# Patient Record
Sex: Male | Born: 1995 | Race: White | Hispanic: No | Marital: Single | Smoking: Never smoker
Health system: Southern US, Community
[De-identification: ages and names within clinical notes are randomized; demographics above are authoritative.]

## PROBLEM LIST (undated history)

## (undated) DIAGNOSIS — K760 Fatty (change of) liver, not elsewhere classified: Secondary | ICD-10-CM

## (undated) HISTORY — DX: Fatty (change of) liver, not elsewhere classified: K76.0

## (undated) HISTORY — PX: WISDOM TOOTH EXTRACTION: SHX21

---
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
## 2013-09-02 ENCOUNTER — Emergency Department (HOSPITAL_COMMUNITY): Payer: BC Managed Care – PPO

## 2013-09-02 ENCOUNTER — Emergency Department (HOSPITAL_COMMUNITY)
Admission: EM | Admit: 2013-09-02 | Discharge: 2013-09-02 | Disposition: A | Discharge status: Home / Self Care | Payer: BC Managed Care – PPO | Attending: Emergency Medicine | Admitting: Emergency Medicine

## 2013-09-02 ENCOUNTER — Encounter (HOSPITAL_COMMUNITY): Payer: Self-pay | Admitting: Emergency Medicine

## 2013-09-02 DIAGNOSIS — N433 Hydrocele, unspecified: Secondary | ICD-10-CM

## 2013-09-02 LAB — URINALYSIS, ROUTINE W REFLEX MICROSCOPIC
Bilirubin Urine: NEGATIVE
Glucose, UA: NEGATIVE mg/dL
HGB URINE DIPSTICK: NEGATIVE
Ketones, ur: NEGATIVE mg/dL
Leukocytes, UA: NEGATIVE
Nitrite: NEGATIVE
PH: 7 (ref 5.0–8.0)
PROTEIN: NEGATIVE mg/dL
Specific Gravity, Urine: 1.01 (ref 1.005–1.030)
Urobilinogen, UA: 0.2 mg/dL (ref 0.0–1.0)

## 2013-09-02 NOTE — ED Notes (Signed)
Pain groin  For 1. 5 mos,  Increased pain with lifting .

## 2013-09-02 NOTE — ED Notes (Signed)
Pt c/o left testicular pain x1 month. Pt states pain has worsened over past week. Pt also reports "some swelling".

## 2013-09-02 NOTE — ED Notes (Signed)
Patient resting in a position of comfort. Family member at bedside. Patient states left testicular pain, denies painful urination and discharge. No needs voiced at this time.

## 2013-09-02 NOTE — ED Provider Notes (Signed)
CSN: [id number]     Arrival date & time 09/02/13  1635 History   First MD Initiated Contact with Patient 09/02/13 1738     Chief Complaint  Patient presents with  . Groin Pain     (Consider location/radiation/quality/duration/timing/severity/associated sxs/prior Treatment) Patient is a 18 y.o. male presenting with groin pain. The history is provided by the patient.  Groin Pain Pertinent negatives include no chest pain, no abdominal pain and no shortness of breath.   patient has pain in his left scrotum/testicle area. He's had for the last 2 weeks on-and-off. Worse with lifting things. No trauma. No dysuria. He states it feels full. No bulging. He denies possibility of STD. The pain is dull. It doesn't hurt much unless he strains.  History reviewed. No pertinent past medical history. History reviewed. No pertinent past surgical history. History reviewed. No pertinent family history. History  Substance Use Topics  . Smoking status: Never Smoker   . Smokeless tobacco: Not on file  . Alcohol Use: No    Review of Systems  Constitutional: Negative for activity change and appetite change.  Respiratory: Negative for chest tightness and shortness of breath.   Cardiovascular: Negative for chest pain.  Gastrointestinal: Negative for nausea, vomiting, abdominal pain and diarrhea.  Genitourinary: Positive for testicular pain. Negative for frequency, hematuria, flank pain, discharge, penile swelling and penile pain.  Musculoskeletal: Negative for back pain and neck stiffness.  Skin: Negative for rash.  Neurological: Negative for weakness.      Allergies  Review of patient's allergies indicates no known allergies.  Home Medications   Prior to Admission medications   Not on File   BP 144/85  Pulse 62  Temp(Src) 98.2 F (36.8 C) (Oral)  Resp 16  Ht 5\' 9"  (1.753 m)  Wt 225 lb (102.059 kg)  BMI 33.21 kg/m2  SpO2 100% Physical Exam  Constitutional: He appears well-developed and  well-nourished.  Cardiovascular: Normal rate and regular rhythm.   Pulmonary/Chest: Effort normal.  Abdominal: Soft. There is no tenderness.  Genitourinary: Prostate normal and penis normal.  Left testicle slightly larger than contralateral. Normal lie. Mild posterior tenderness. There is no fluctuance superior it may be hydrocele. No hernias palpated  Musculoskeletal: Normal range of motion.  Neurological: He is alert.    ED Course  Procedures (including critical care time) Labs Review Labs Reviewed  GC/CHLAMYDIA PROBE AMP  URINALYSIS, ROUTINE W REFLEX MICROSCOPIC    Imaging Review [place] Scrotum  09/02/2013   CLINICAL DATA:  Left testicular pain.  EXAM: SCROTAL ULTRASOUND  DOPPLER ULTRASOUND OF THE TESTICLES  TECHNIQUE: Complete ultrasound examination of the testicles, epididymis, and other scrotal structures was performed. Color and spectral Doppler ultrasound were also utilized to evaluate blood flow to the testicles.  COMPARISON:  None.  FINDINGS: Right testicle  Measurements: 4.8 x 2.3 x 2.3 cm. No mass or microlithiasis visualized.  Left testicle  Measurements: 4.3 x 2.3 x 3.2 cm. No mass or microlithiasis visualized.  Right epididymis:  Normal in size and appearance.  Left epididymis:  Normal in size and appearance.  Hydrocele:  Mottled left hydrocele is noted.  Varicocele:  None visualized.  Pulsed Doppler interrogation of both testes demonstrates low resistance arterial and venous waveforms bilaterally.  IMPRESSION: Mild left hydrocele.  No evidence of testicular torsion or mass.   Electronically Signed   By: Roque LiasJames  Green M.D.   On: 09/02/2013 20:22   [place] Art/ven Flow Abd Pelv Doppler  09/02/2013   CLINICAL DATA:  Left testicular  pain.  EXAM: SCROTAL ULTRASOUND  DOPPLER ULTRASOUND OF THE TESTICLES  TECHNIQUE: Complete ultrasound examination of the testicles, epididymis, and other scrotal structures was performed. Color and spectral Doppler ultrasound were also utilized to evaluate blood  flow to the testicles.  COMPARISON:  None.  FINDINGS: Right testicle  Measurements: 4.8 x 2.3 x 2.3 cm. No mass or microlithiasis visualized.  Left testicle  Measurements: 4.3 x 2.3 x 3.2 cm. No mass or microlithiasis visualized.  Right epididymis:  Normal in size and appearance.  Left epididymis:  Normal in size and appearance.  Hydrocele:  Mottled left hydrocele is noted.  Varicocele:  None visualized.  Pulsed Doppler interrogation of both testes demonstrates low resistance arterial and venous waveforms bilaterally.  IMPRESSION: Mild left hydrocele.  No evidence of testicular torsion or mass.   Electronically Signed   By: Roque LiasJames  Green M.D.   On: 09/02/2013 20:22     EKG Interpretation None      MDM   Final diagnoses:  Hydrocele in adult    Patient testicle pain. Has hydrocele on left. Likely cause the pain. Gonorrhea and Chlamydia sent in urine. Urinalysis negative. Will followup with urology as needed    Juliet RudeNathan R. Rubin PayorPickering, MD 09/02/13 2052

## 2013-09-02 NOTE — ED Notes (Signed)
Ultrasound at bedside at this time.

## 2013-09-02 NOTE — Discharge Instructions (Signed)
Hydrocele, Adult Fluid can collect around the testicles. This fluid forms in a sac. This condition is called a hydrocele. The collected fluid causes swelling of the scrotum. Usually, it affects just one testicle. Most of the time, the condition does not cause pain. Sometimes, the hydrocele goes away on its own. Other times, surgery is needed to get rid of the fluid. CAUSES A hydrocele does not develop often. Different things can cause a hydrocele in a man, including:  Injury to the scrotum.  Infection.  X-ray of the area around the scrotum.  A tumor or cancer of the testicle.  Twisting of a testicle.  Decreased blood flow to the scrotum. SYMPTOMS   Swelling without pain. The hydrocele feels like a water-filled balloon.  Swelling with pain. This can occur if the hydrocele was caused by infection or twisting.  Mild discomfort in the scrotum.  The hydrocele may feel heavy.  Swelling that gets smaller when you lie down. DIAGNOSIS  Your caregiver will do a physical exam to decide if you have a hydrocele. This may include:  Asking questions about your overall health, today and in the past. Your caregiver may ask about any injuries, X-rays, or infections.  Pushing on your abdomen or asking you to change positions to see if the size of the hydrocele changes.  Shining a light through the scrotum (transillumination) to see if the fluid inside the scrotum is clear.  Blood tests and urine tests to check for infection.  Imaging studies that take pictures of the scrotum and testicles. TREATMENT  Treatment depends in part on what caused the condition. Options include:  Watchful waiting. Your caregiver checks the hydrocele every so often.  Different surgeries to drain the fluid.  A needle may be put into the scrotum to drain fluid (needle aspiration). Fluid often returns after this type of treatment.  A cut (incision) may be made in the scrotum to remove the fluid sac  (hydrocelectomy).  An incision may be made in the groin to repair a hydrocele that has contact with abdominal fluids (communicating hydrocele).  Medicines to treat an infection (antibiotics). HOME CARE INSTRUCTIONS  What you need to do at home may depend on the cause of the hydrocele and type of treatment. In general:  Take all medicine as directed by your caregiver. Follow the directions carefully.  Ask your caregiver if there is anything you should not do while you recover (activities, lifting, work, sex).  If you had surgery to repair a communicating hydrocele, recovery time may vary. Ask you caregiver about your recovery time.  Avoid heavy lifting for 4 to 6 weeks.  If you had an incision on the scrotum or groin, wash it for 2 to 3 days after surgery. Do this as long as the skin is closed and there are no gaps in the wound. Wash gently, and avoid rubbing the incision.  Keep all follow-up appointments. SEEK MEDICAL CARE IF:   Your scrotum seems to be getting larger.  The area becomes more and more uncomfortable. SEEK IMMEDIATE MEDICAL CARE IF:  You have a fever. Document Released: 08/04/2009 Document Revised: 12/05/2012 Document Reviewed: 08/04/2009 ExitCare Patient Information 2015 [employer]. This information is not intended to replace advice given to you by your health care provider. Make sure you discuss any questions you have with your health care provider.  

## 2013-09-02 NOTE — ED Notes (Signed)
Patient verbalizes understanding of discharge instructions, and follow up care if needed. Patient denies pain at time of discharge, patient ambulatory out of department at this time with family.

## 2013-09-03 LAB — GC/CHLAMYDIA PROBE AMP
CT PROBE, AMP APTIMA: NEGATIVE
GC PROBE AMP APTIMA: NEGATIVE

## 2015-06-21 IMAGING — US US SCROTUM
1 series · 14 of 25 positions shown · non-contrast
Comparison: None.

CLINICAL DATA: Left testicular pain.

EXAM:
SCROTAL ULTRASOUND
DOPPLER ULTRASOUND OF THE TESTICLES
TECHNIQUE: Complete ultrasound examination of the testicles, epididymis, and
other scrotal structures was performed. Color and spectral Doppler
ultrasound were also utilized to evaluate blood flow to the
testicles.

[Series 1: us scrotum · 0.06mm/px · 14 of 66 slices shown]
[im 1/66]
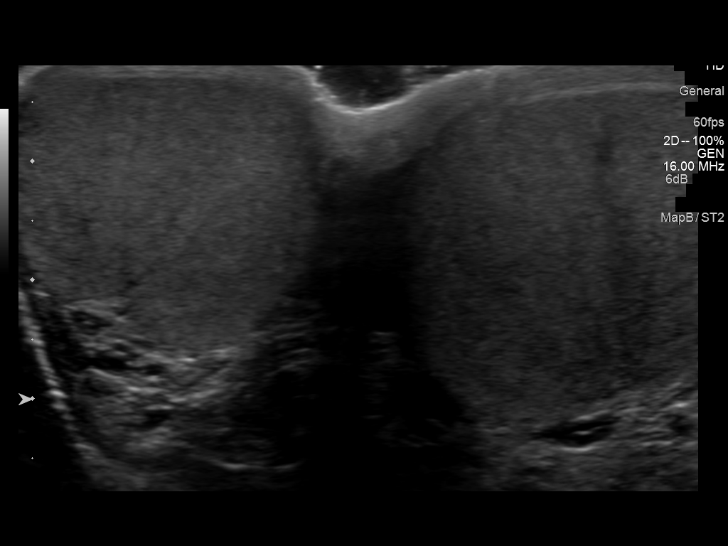
[im 6/66]
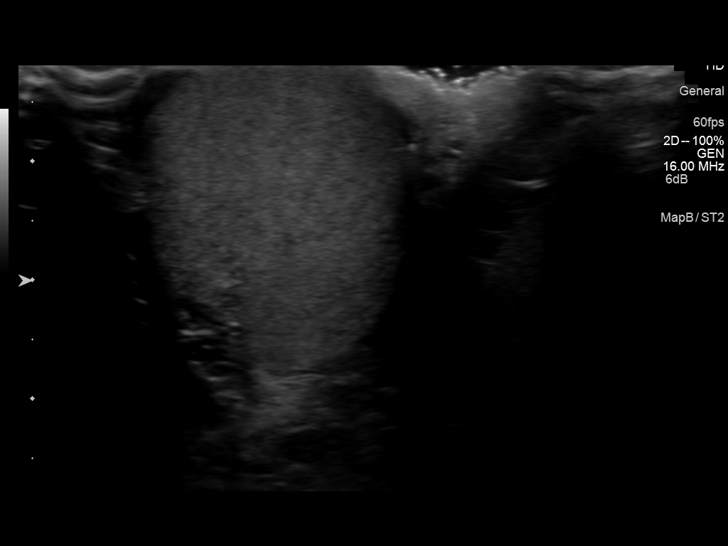
[im 11/66]
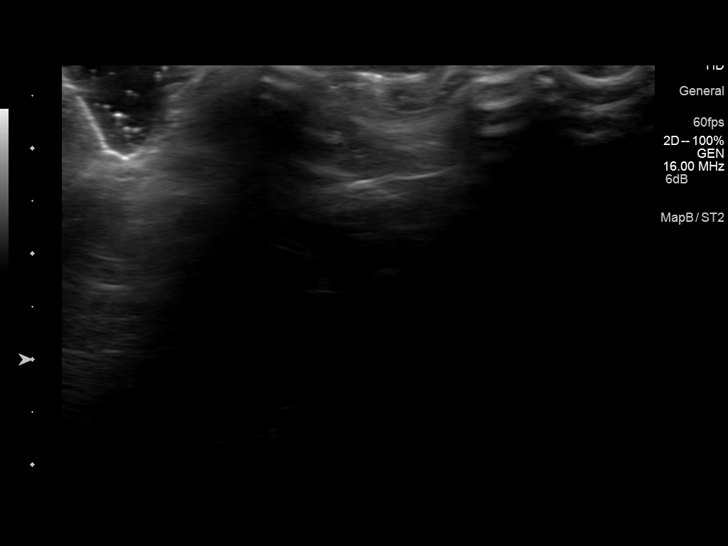
[im 17/66]
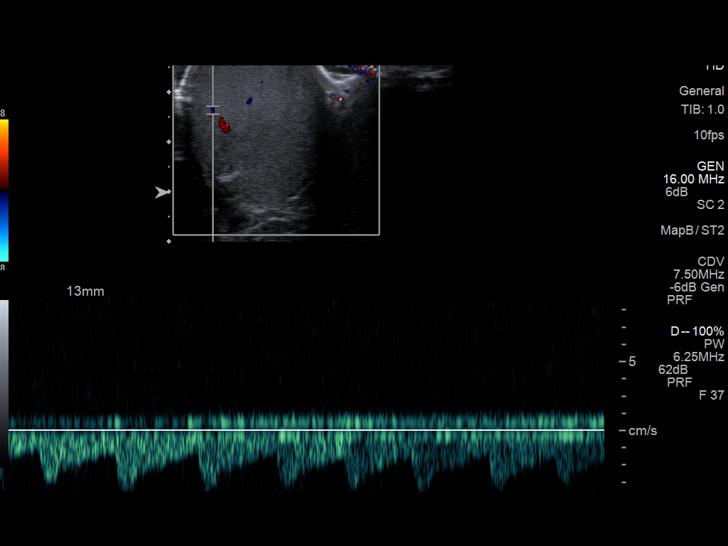
[im 22/66]
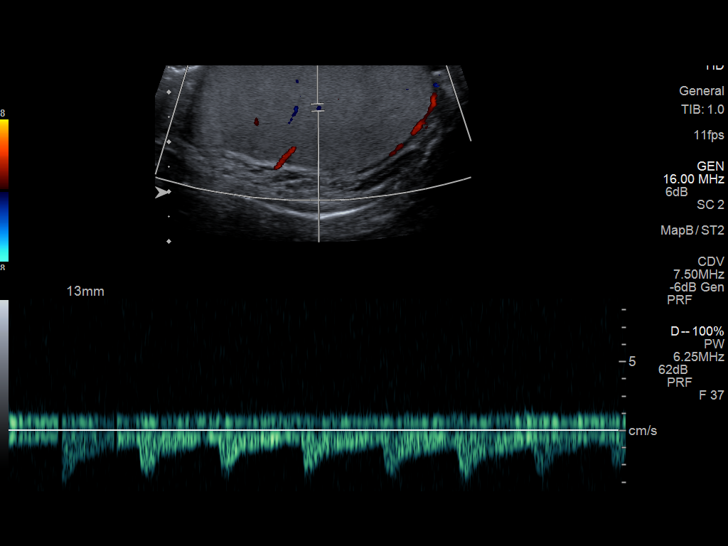
[im 25/66]
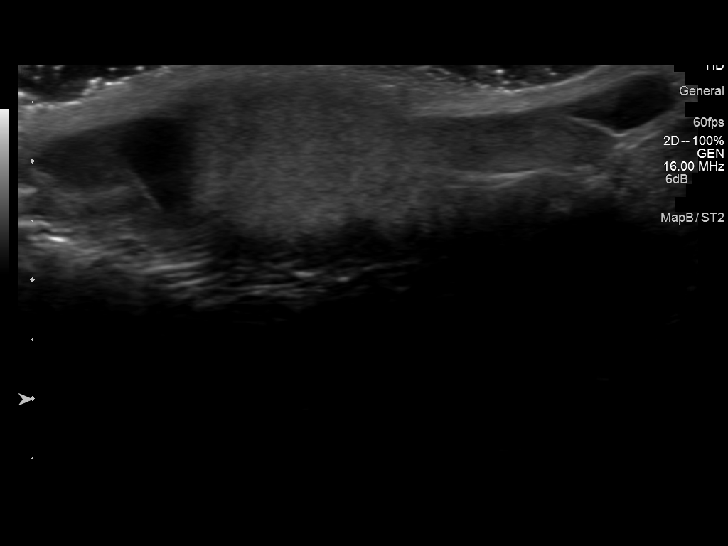
[im 30/66]
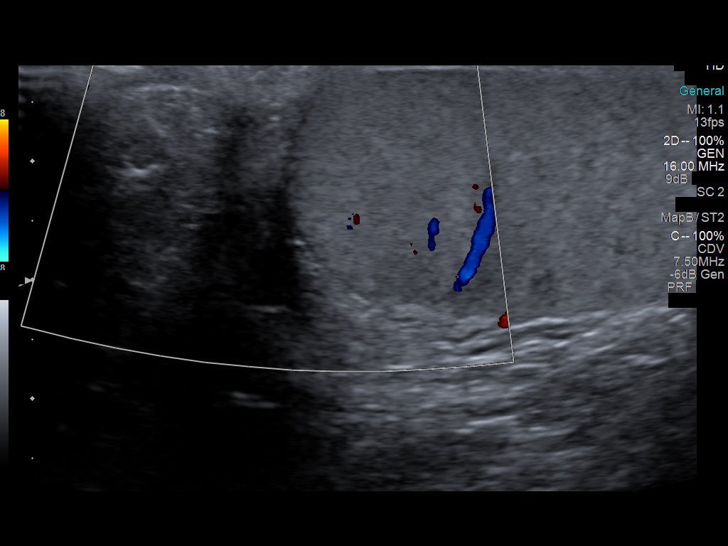
[im 36/66]
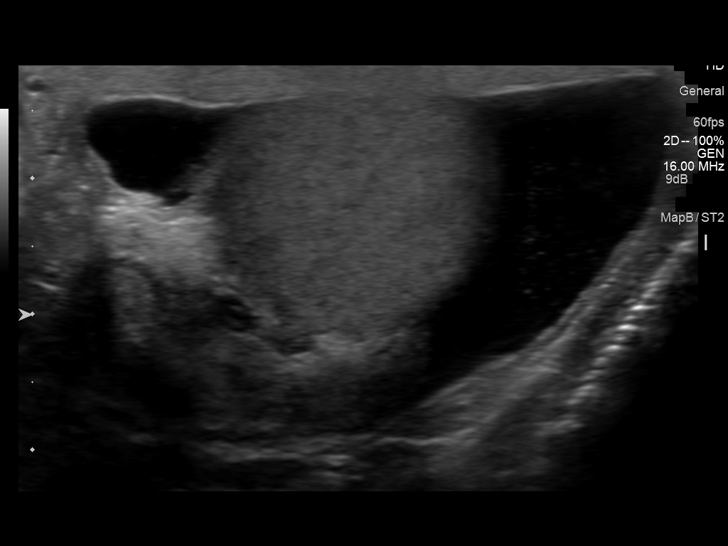
[im 41/66]
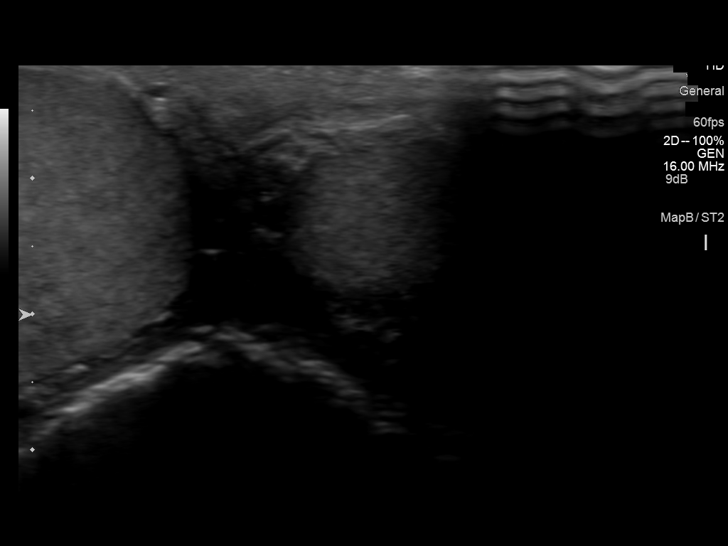
[im 44/66]
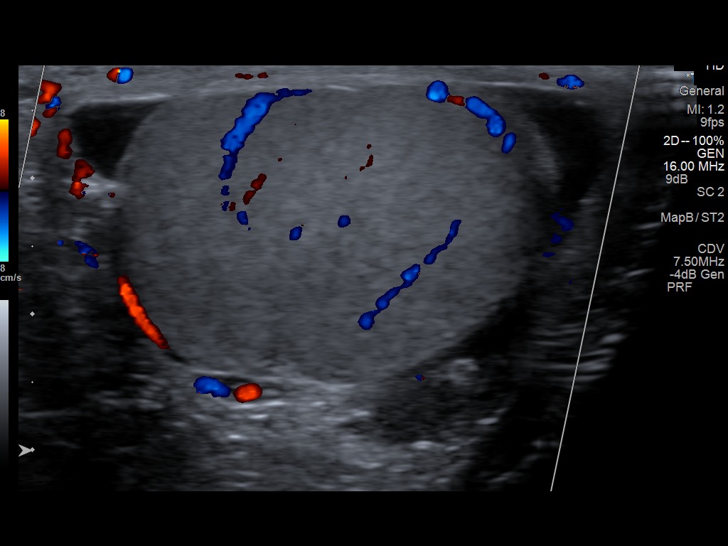
[im 49/66]
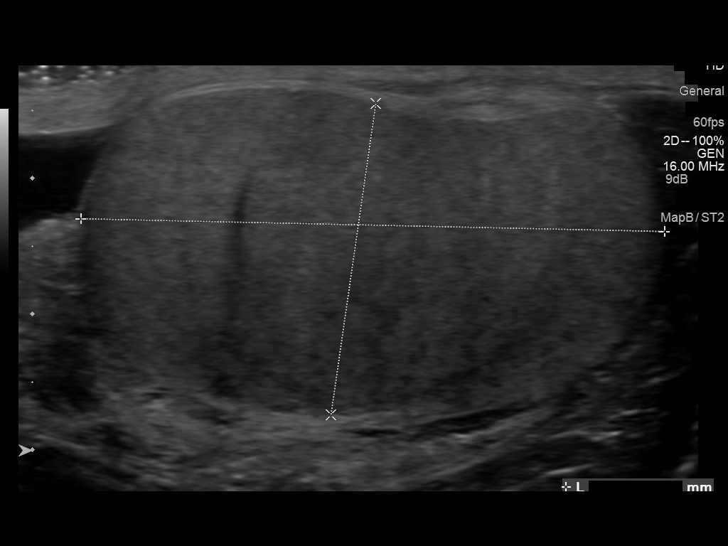
[im 55/66]
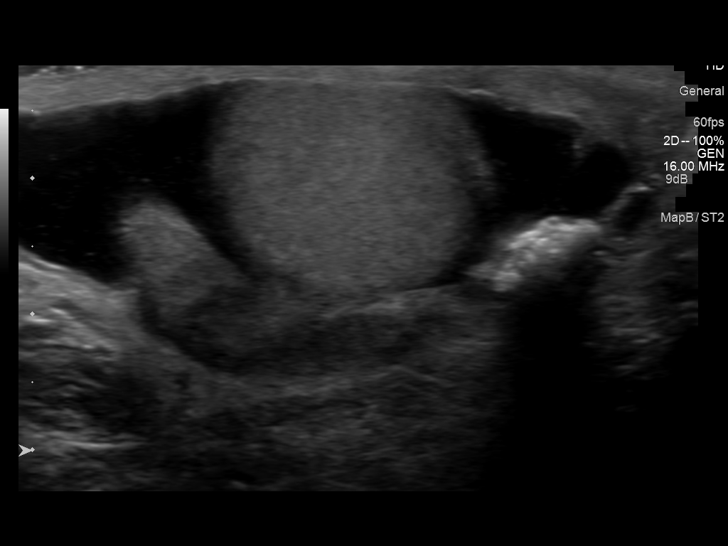
[im 60/66]
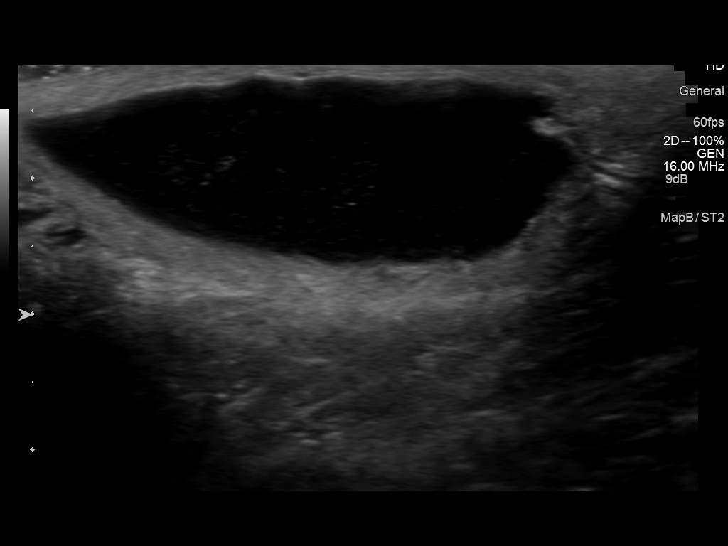
[im 66/66]
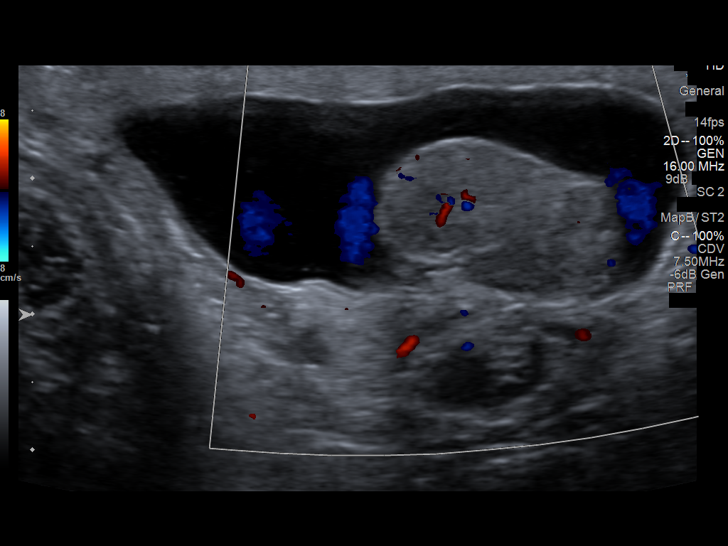

[14 of 25 positions shown; findings below may reference images not displayed]

FINDINGS: Right testicle

Measurements: 4.8 x 2.3 x 2.3 cm. No mass or microlithiasis
visualized.

Left testicle

Measurements: 4.3 x 2.3 x 3.2 cm. No mass or microlithiasis
visualized.

Right epididymis:  Normal in size and appearance.

Left epididymis:  Normal in size and appearance.

Hydrocele:  Mottled left hydrocele is noted.

Varicocele:  None visualized.

Pulsed Doppler interrogation of both testes demonstrates low
resistance arterial and venous waveforms bilaterally.
IMPRESSION: Mild left hydrocele.  No evidence of testicular torsion or mass.

## 2019-01-11 ENCOUNTER — Other Ambulatory Visit: Payer: Self-pay

## 2019-01-11 DIAGNOSIS — Z20822 Contact with and (suspected) exposure to covid-19: Secondary | ICD-10-CM

## 2019-01-14 LAB — NOVEL CORONAVIRUS, NAA: SARS-CoV-2, NAA: NOT DETECTED

## 2019-03-15 ENCOUNTER — Other Ambulatory Visit: Payer: Self-pay

## 2021-03-30 ENCOUNTER — Encounter: Payer: Self-pay | Admitting: Emergency Medicine

## 2021-03-30 ENCOUNTER — Ambulatory Visit
Admission: EM | Admit: 2021-03-30 | Discharge: 2021-03-30 | Disposition: A | Discharge status: Home / Self Care | Payer: 59 | Attending: Emergency Medicine | Admitting: Emergency Medicine

## 2021-03-30 DIAGNOSIS — J988 Other specified respiratory disorders: Secondary | ICD-10-CM | POA: Diagnosis not present

## 2021-03-30 DIAGNOSIS — B9789 Other viral agents as the cause of diseases classified elsewhere: Secondary | ICD-10-CM | POA: Diagnosis not present

## 2021-03-30 DIAGNOSIS — Z20822 Contact with and (suspected) exposure to covid-19: Secondary | ICD-10-CM | POA: Diagnosis not present

## 2021-03-30 DIAGNOSIS — J029 Acute pharyngitis, unspecified: Secondary | ICD-10-CM | POA: Diagnosis not present

## 2021-03-30 LAB — POCT RAPID STREP A (OFFICE): Rapid Strep A Screen: NEGATIVE

## 2021-03-30 LAB — POCT MONO SCREEN (KUC): Mono, POC: NEGATIVE

## 2021-03-30 MED ORDER — PROMETHAZINE-DM 6.25-15 MG/5ML PO SYRP: 5.0000 mL | ORAL_SOLUTION | Freq: Four times a day (QID) | ORAL | 0 refills | Status: DC | PRN

## 2021-03-30 MED ORDER — FLUTICASONE PROPIONATE 50 MCG/ACT NA SUSP: 2.0000 | Freq: Every day | NASAL | 0 refills | Status: DC

## 2021-03-30 NOTE — ED Triage Notes (Signed)
Headache, sore throat, fatigue, body aches, chills started this morning.

## 2021-03-30 NOTE — Discharge Instructions (Addendum)
COVID, flu will be back in several days.  We will consider starting antiviral treatment as this could be a new infection over what ever you had over the past week, think about if that is something you would like to do.  Your strep, mono were negative.  I am sending off a throat culture to make sure that we are not missing an infection that would require antibiotic treatment.  In the meantime, saline nasal irrigation, Flonase, Promethazine DM for the cough.  400 mg of ibuprofen combined with 1000 mg of Tylenol together 3-4 times a day as needed for pain.

## 2021-03-30 NOTE — ED Provider Notes (Signed)
HPI  SUBJECTIVE:  Jerry Lawson is a 26 y.o. male who presents with headache, malaise, sore throat, nasal congestion, rhinorrhea for the past week.  He reports chills, headache, body aches which is new today.  No fevers, sinus pain or pressure, facial swelling, upper dental pain, loss of sense of smell or taste, wheezing, shortness of breath, nausea, vomiting, diarrhea, abdominal pain, ear pain.  No known COVID or flu exposure.  He did not get this years flu vaccine.  He has had 2 doses of the COVID-vaccine.  No antibiotics in the past month.  No antipyretic in the past 6 hours.  He has been alternating 400 mg of ibuprofen with 1000 mg of Tylenol every 4 hours with symptom improvement.  Symptoms are worse with activity.  He has no past medical history.  PMD: None   History reviewed. No pertinent past medical history.  History reviewed. No pertinent surgical history.  History reviewed. No pertinent family history.  Social History   Tobacco Use   Smoking status: Never  Substance Use Topics   Alcohol use: No   Drug use: No    No current facility-administered medications for this encounter.  Current Outpatient Medications:    fluticasone (FLONASE) 50 MCG/ACT nasal spray, Place 2 sprays into both nostrils daily., Disp: 16 g, Rfl: 0   promethazine-dextromethorphan (PROMETHAZINE-DM) 6.25-15 MG/5ML syrup, Take 5 mLs by mouth 4 (four) times daily as needed for cough., Disp: 118 mL, Rfl: 0  No Known Allergies   ROS  As noted in HPI.   Physical Exam  BP 125/64 (BP Location: Right Arm)    Pulse 79    Temp 98.8 F (37.1 C) (Oral)    Resp 18    SpO2 97%   Constitutional: Well developed, well nourished, no acute distress Eyes: PERRL, EOMI, conjunctiva normal bilaterally HENT: Normocephalic, atraumatic,mucus membranes moist.  Positive mucoid nasal congestion.  Erythematous, swollen turbinates.  No maxillary, frontal sinus tenderness.  Erythematous, enlarged tonsils without exudates.   Uvula midline.  Positive postnasal drip. Neck: No cervical lymphadenopathy Respiratory: Clear to auscultation bilaterally, no rales, no wheezing, no rhonchi Cardiovascular: Normal rate and rhythm, no murmurs, no gallops, no rubs GI: Soft, nondistended, normal bowel sounds, nontender, no splenomegaly skin: No rash, skin intact Musculoskeletal: No edema, no tenderness, no deformities Neurologic: Alert & oriented x 3, CN III-XII grossly intact, no motor deficits, sensation grossly intact Psychiatric: Speech and behavior appropriate   ED Course   Medications - No data to display  Orders Placed This Encounter  Procedures   Covid-19, Flu A+B ([facility])    Standing Status:   Standing    Number of Occurrences:   1   POCT mono screen    Standing Status:   Standing    Number of Occurrences:   1   POCT rapid strep A    Standing Status:   Standing    Number of Occurrences:   1   No results found for this or any previous visit (from the past 24 hour(s)). No results found. Results for orders placed or performed during the hospital encounter of 03/30/21  Covid-19, Flu A+B ([facility])   Specimen: Nasopharyngeal   Naso  Result Value Ref Range   SARS-CoV-2, NAA Not Detected Not Detected   Influenza A, NAA Not Detected Not Detected   Influenza B, NAA Not Detected Not Detected   Test Information: Comment   Culture, group A strep   Specimen: Throat  Result Value Ref Range  Specimen Description      THROAT Performed at [facility], [address] [place], [place] [address]    Special Requests      NONE Performed at [facility], [address], [place], [place] [address]    Culture MODERATE STREPTOCOCCUS,BETA HEMOLYTIC NOT GROUP A    Report Status 04/01/2021 FINAL   POCT mono screen  Result Value Ref Range   Mono, POC Negative Negative  POCT rapid strep A  Result Value Ref Range   Rapid Strep A Screen Negative Negative     ED Clinical Impression  1. Viral respiratory infection    2. Exposure to COVID-19 virus   3. Acute pharyngitis, unspecified etiology      ED Assessment/Plan  Difficult to tell if this is an acute worsening of an ongoing illness, or new illness.  Checking COVID, flu.  Consider starting antivirals, although if we are considering this acute worsening of his symptoms from the past with, he will be out of the window for antiviral treatment.  However, there does not appear to be any evidence of otitis, sinusitis, pneumonia. will also check a strep, mono because of swollen erythematous tonsils.  Work note for tomorrow.  Home with saline nasal irrigation, Flonase, Promethazine DM, Tylenol/ibuprofen.  Further treatment to be determined on labs.  Covid flu pcr negative.   Discussed labs, MDM, treatment plan, and plan for follow-up with patient patient agrees with plan.   Meds ordered this encounter  Medications   fluticasone (FLONASE) 50 MCG/ACT nasal spray    Sig: Place 2 sprays into both nostrils daily.    Dispense:  16 g    Refill:  0   promethazine-dextromethorphan (PROMETHAZINE-DM) 6.25-15 MG/5ML syrup    Sig: Take 5 mLs by mouth 4 (four) times daily as needed for cough.    Dispense:  118 mL    Refill:  0      *This clinic note was created using Scientist, clinical (histocompatibility and immunogenetics). Therefore, there may be occasional mistakes despite careful proofreading. ?    Domenick Gong, MD 04/01/21 1958

## 2021-03-31 LAB — COVID-19, FLU A+B NAA
Influenza A, NAA: NOT DETECTED
Influenza B, NAA: NOT DETECTED
SARS-CoV-2, NAA: NOT DETECTED

## 2021-04-01 ENCOUNTER — Telehealth: Payer: Self-pay | Admitting: Family Medicine

## 2021-04-01 LAB — CULTURE, GROUP A STREP (THRC)

## 2021-04-01 MED ORDER — AMOXICILLIN 875 MG PO TABS: 875.0000 mg | ORAL_TABLET | Freq: Two times a day (BID) | ORAL | 0 refills | Status: DC

## 2021-04-01 NOTE — Telephone Encounter (Signed)
Patient calling regarding his throat culture which was just resulted today.  Positive for moderate strep bacteria, will electronically send amoxicillin.

## 2022-03-31 ENCOUNTER — Encounter: Payer: Self-pay | Admitting: Orthopaedic Surgery

## 2022-03-31 ENCOUNTER — Ambulatory Visit: Payer: 59 | Admitting: Orthopaedic Surgery

## 2022-03-31 VITALS — BP 129/88 | HR 69 | Ht 69.0 in | Wt 235.0 lb

## 2022-03-31 DIAGNOSIS — S86012A Strain of left Achilles tendon, initial encounter: Secondary | ICD-10-CM | POA: Diagnosis not present

## 2022-03-31 NOTE — Progress Notes (Signed)
Subjective:    Patient ID: Jerry Lawson, male    DOB: [birth year], 27 y.o.   MRN: [id number]  HPI He hurt his left Achilles playing basketball yesterday.  He went to [facility].  X-rays were done and were negative.  No defect was seen at Achilles.  He was given prednisone dose pack and ibuprofen.  He iced it.  He is much better today.  He has less pain but a slight limp on the left.  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  He did not do any stretching prior to playing.  I have gone over this with him to do in the future.  He has to do considerable climbing at work.  I will keep him out of work.  I have reviewed the notes from the urgent care.  I have independently reviewed and interpreted x-rays of this patient done at another site by another physician or qualified health professional.  Review of Systems  Constitutional:  Positive for activity change.  Musculoskeletal:  Positive for arthralgias and gait problem.  All other systems reviewed and are negative. For Review of Systems, all other systems reviewed and are negative.  The following is a summary of the past history medically, past history surgically, known current medicines, social history and family history.  This information is gathered electronically by the computer from prior information and documentation.  I review this each visit and have found including this information at this point in the chart is beneficial and informative.   History reviewed. No pertinent past medical history.  History reviewed. No pertinent surgical history.  Current Outpatient Medications on File Prior to Visit  Medication Sig Dispense Refill   ibuprofen (ADVIL) 800 MG tablet Take 800 mg by mouth 3 (three) times daily.     methylPREDNISolone (MEDROL DOSEPAK) 4 MG TBPK tablet Take 4 mg by mouth taper from 4 doses each day to 1 dose and stop.     No current facility-administered medications on file prior to visit.     Social History   Socioeconomic History   Marital status: Single    Spouse name: Not on file   Number of children: Not on file   Years of education: Not on file   Highest education level: Not on file  Occupational History   Not on file  Tobacco Use   Smoking status: Never   Smokeless tobacco: Not on file  Substance and Sexual Activity   Alcohol use: No   Drug use: No   Sexual activity: Not on file  Other Topics Concern   Not on file  Social History Narrative   Not on file   Social Determinants of Health   Financial Resource Strain: Not on file  Food Insecurity: Not on file  Transportation Needs: Not on file  Physical Activity: Not on file  Stress: Not on file  Social Connections: Not on file  Intimate Partner Violence: Not on file    History reviewed. No pertinent family history.  BP 129/88   Pulse 69   Ht 5\' 9"  (1.753 m)   Wt 235 lb (106.6 kg)   BMI 34.70 kg/m   Body mass index is 34.7 kg/m.      Objective:   Physical Exam Vitals and nursing note reviewed. Exam conducted with a chaperone present.  Constitutional:      Appearance: He is well-developed.  HENT:     Head: Normocephalic and atraumatic.  Eyes:  Conjunctiva/sclera: Conjunctivae normal.     Pupils: Pupils are equal, round, and reactive to light.  Cardiovascular:     Rate and Rhythm: Normal rate and regular rhythm.  Pulmonary:     Effort: Pulmonary effort is normal.  Abdominal:     Palpations: Abdomen is soft.  Musculoskeletal:     Cervical back: Normal range of motion and neck supple.       Feet:  Skin:    General: Skin is warm and dry.  Neurological:     Mental Status: He is alert and oriented to person, place, and time.     Cranial Nerves: No cranial nerve deficit.     Motor: No abnormal muscle tone.     Coordination: Coordination normal.     Deep Tendon Reflexes: Reflexes are normal and symmetric. Reflexes normal.  Psychiatric:        Behavior: Behavior normal.         Thought Content: Thought content normal.        Judgment: Judgment normal.           Assessment & Plan:   Encounter Diagnosis  Name Primary?   Strain of Achilles tendon, left, initial encounter Yes   I will give CAM walker.  Use voltaren gel to area tid, ice.  Continue medicine given from urgent care.  Stay out of work.  Return in one week.  No basketball playing this week.  Call if any problem.  Precautions discussed.  Electronically Signed Sanjuana Kava, MD 2/1/202410:05 AM

## 2022-03-31 NOTE — Patient Instructions (Addendum)
OOW NOTE UNTIL AFTER NEXT APPT  ROV 1 WEEK LT FOOT ACHILLES TENDONITIS  CONTINUE TO TAKE THE PREDNISONE ALREADY PRESCRIBED TO YOU.  WEAR THE BOOT.

## 2022-04-06 ENCOUNTER — Encounter: Payer: Self-pay | Admitting: Orthopaedic Surgery

## 2022-04-06 ENCOUNTER — Ambulatory Visit: Payer: 59 | Admitting: Orthopaedic Surgery

## 2022-04-06 DIAGNOSIS — S86012D Strain of left Achilles tendon, subsequent encounter: Secondary | ICD-10-CM

## 2022-04-06 DIAGNOSIS — S86012A Strain of left Achilles tendon, initial encounter: Secondary | ICD-10-CM | POA: Insufficient documentation

## 2022-04-06 NOTE — Patient Instructions (Addendum)
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 MAKE SURE YOU STRETCH BEFORE ANY PLAYING!!!  WE WILL SEE YOU AS YOU NEED [place].   RETURN TO WORK ON 04/11/2022

## 2022-04-06 NOTE — Progress Notes (Signed)
I am better.  He is much improved with the left Achilles tendon area.  He has no swelling, no pain.  He has no new events.  ROM of the left ankle is full, no defect of Achilles, no swelling.  Encounter Diagnosis  Name Primary?   Strain of Achilles tendon, left, subsequent encounter Yes   He can resume work on Monday, February 12.    He returned the CAM walker.  Do stretching exercises before playing any sports.  Return prn.  Call if any problem.  Precautions discussed.  Electronically Signed Sanjuana Kava, MD 2/7/20248:54 AM

## 2022-04-07 ENCOUNTER — Ambulatory Visit: Payer: 59 | Admitting: Orthopaedic Surgery

## 2022-04-28 ENCOUNTER — Encounter: Payer: Self-pay | Admitting: Radiology

## 2023-08-07 ENCOUNTER — Encounter: Payer: Self-pay | Admitting: Gastroenterology

## 2023-08-19 NOTE — Progress Notes (Unsigned)
 Referring Provider:*** Primary Care Physician:  Patient, No Pcp Per Primary Gastroenterologist:  Dr. PIERRETTE  No chief complaint on file.   HPI:   Jerry Lawson is a 28 y.o. male presenting today at the request of Trudy Vaughn FALCON, MD for fatty liver.   Per referral:  Abdominal ultrasound 06/12/2023 with mild fatty change in liver parenchyma. Labs 07/26/2023 with AST 14, ALT 13, alk phos 39, total bilirubin 0.6. Office visit with Dr. Trudy on 06/02/2023-patient states that he cut out alcohol.  Per history, 12 pack each weekend.   Today:   No past medical history on file.  No past surgical history on file.  Current Outpatient Medications  Medication Sig Dispense Refill   ibuprofen (ADVIL) 800 MG tablet Take 800 mg by mouth 3 (three) times daily.     methylPREDNISolone (MEDROL DOSEPAK) 4 MG TBPK tablet Take 4 mg by mouth taper from 4 doses each day to 1 dose and stop.     No current facility-administered medications for this visit.    Allergies as of 08/21/2023   (No Known Allergies)    No family history on file.  Social History   Socioeconomic History   Marital status: Single    Spouse name: Not on file   Number of children: Not on file   Years of education: Not on file   Highest education level: Not on file  Occupational History   Not on file  Tobacco Use   Smoking status: Never   Smokeless tobacco: Not on file  Substance and Sexual Activity   Alcohol use: No   Drug use: No   Sexual activity: Not on file  Other Topics Concern   Not on file  Social History Narrative   Not on file   Social Drivers of Health   Financial Resource Strain: Not on file  Food Insecurity: Not on file  Transportation Needs: Not on file  Physical Activity: Not on file  Stress: Not on file  Social Connections: Unknown (07/13/2021)   Received from [facility]   Social Network    Social Network: Not on file  Intimate Partner Violence: Unknown (06/04/2021)   Received from  [facility]   HITS    Physically Hurt: Not on file    Insult or Talk Down To: Not on file    Threaten Physical Harm: Not on file    Scream or Curse: Not on file    Review of Systems: Gen: Denies any fever, chills, fatigue, weight loss, lack of appetite.  CV: Denies chest pain, heart palpitations, peripheral edema, syncope.  Resp: Denies shortness of breath at rest or with exertion. Denies wheezing or cough.  GI: Denies dysphagia or odynophagia. Denies jaundice, hematemesis, fecal incontinence. GU : Denies urinary burning, urinary frequency, urinary hesitancy MS: Denies joint pain, muscle weakness, cramps, or limitation of movement.  Derm: Denies rash, itching, dry skin Psych: Denies depression, anxiety, memory loss, and confusion Heme: Denies bruising, bleeding, and enlarged lymph nodes.  Physical Exam: There were no vitals taken for this visit. General:   Alert and oriented. Pleasant and cooperative. Well-nourished and well-developed.  Head:  Normocephalic and atraumatic. Eyes:  Without icterus, sclera clear and conjunctiva pink.  Ears:  Normal auditory acuity. Lungs:  Clear to auscultation bilaterally. No wheezes, rales, or rhonchi. No distress.  Heart:  S1, S2 present without murmurs appreciated.  Abdomen:  +BS, soft, non-tender and non-distended. No HSM noted. No guarding or rebound. No masses appreciated.  Rectal:  Deferred  Msk:  Symmetrical without gross deformities. Normal posture. Extremities:  Without edema. Neurologic:  Alert and  oriented x4;  grossly normal neurologically. Skin:  Intact without significant lesions or rashes. Psych:  Alert and cooperative. Normal mood and affect.    Assessment:     Plan:  ***   Josette Centers, PA-C [facility] 08/21/2023

## 2023-08-21 ENCOUNTER — Encounter: Payer: Self-pay | Admitting: Gastroenterology

## 2023-08-21 ENCOUNTER — Ambulatory Visit: Admitting: Gastroenterology

## 2023-08-21 VITALS — BP 133/83 | HR 65 | Temp 98.1°F | Ht 69.0 in | Wt 248.8 lb

## 2023-08-21 DIAGNOSIS — K760 Fatty (change of) liver, not elsewhere classified: Secondary | ICD-10-CM

## 2023-08-21 DIAGNOSIS — R7989 Other specified abnormal findings of blood chemistry: Secondary | ICD-10-CM

## 2023-08-21 DIAGNOSIS — K5900 Constipation, unspecified: Secondary | ICD-10-CM | POA: Diagnosis not present

## 2023-08-21 NOTE — Patient Instructions (Addendum)
 Please have labs completed at [facility].  I do recommend that you decrease alcohol consumption to no more than 2 standard drinks per day on the weekend.  If your liver enzymes are elevated, we will need to decrease this further.  Instructions for fatty liver: Recommend 1-2# weight loss per week until ideal body weight through exercise & diet. Low fat/cholesterol diet.   Avoid sweets, sodas, fruit juices, sweetened beverages like tea, etc. Gradually increase exercise from 15 min daily up to 1 hr per day 5 days/week.  Also recommend that you avoid over-the-counter supplements/herbal supplements.  Specifically, I recommend that you avoid Ashwagandha and turmeric.  To help with mild constipation: Start Benefiber 1 tablespoon daily x 2 weeks, then increase to twice daily.  I will plan to see you back in the office in 3 months or sooner if needed.  Josette Centers, PA-C [facility]

## 2023-08-25 LAB — HEPATIC FUNCTION PANEL
ALT: 22 IU/L (ref 0–44)
AST: 33 IU/L (ref 0–40)
Albumin: 4.6 g/dL (ref 4.3–5.2)
Alkaline Phosphatase: 49 IU/L (ref 44–121)
Bilirubin Total: 0.5 mg/dL (ref 0.0–1.2)
Bilirubin, Direct: 0.15 mg/dL (ref 0.00–0.40)
Total Protein: 7.3 g/dL (ref 6.0–8.5)

## 2023-08-27 ENCOUNTER — Ambulatory Visit: Payer: Self-pay | Admitting: Gastroenterology

## 2023-10-13 ENCOUNTER — Encounter: Payer: Self-pay | Admitting: Gastroenterology

## 2023-10-20 ENCOUNTER — Encounter: Payer: Self-pay | Admitting: Radiology

## 2023-11-15 ENCOUNTER — Telehealth: Payer: Self-pay | Admitting: Gastroenterology

## 2023-11-15 NOTE — Telephone Encounter (Signed)
 Called patient and left voicemail to rescheduled appointment on Sept 29 with Courtney. Patient has not called back.

## 2023-11-22 ENCOUNTER — Ambulatory Visit: Admitting: Gastroenterology

## 2023-11-24 NOTE — Progress Notes (Signed)
 GI Office Note    Referring Provider: Trudy Vaughn FALCON, MD Primary Care Physician:  Trudy Vaughn FALCON, MD Primary Gastroenterologist: Lamar HERO.Rourk, MD  Date:  11/27/2023  ID:  Jerry Lawson, DOB December 14, 1995, MRN [id number]   Chief Complaint   Chief Complaint  Patient presents with   Follow-up    Doing well, no issues   History of Present Illness  Jerry Lawson is a 28 y.o. male with a history of fatty liver with elevated LFTs and mild constipation presenting today for follow up.  Negative hepatitis C antibody in November 2024.  Referred for concerns of fatty liver in May/June 2025.  In April patient had reported to his PCP that he had stopped all alcohol use, used to drink a 12 pack each weekend.  Abdominal ultrasound April 2025: Mild fatty changes.  Labs in May 2025: AST 14, ALT 13, alk phos 39, T. bili 0.6.  Initial office visit 08/21/23.  Confirmed 12 pack of beer on the weekends and have done this since his early 75s.  At 1 point went 90 days without drinking given his elevated liver enzymes and had repeat blood work in May and then resumed his drinking of alcohol at the end of May.  For over-the-counter supplements he reports multivitamins, black seed oil, and some Ashwaganda and sea moss mixture.  Noted some intermittent constipation which was chronic for him.  Admitted to not eating very balanced diet but does consume lots of protein.  Denied family history of colon cancer.  Mother does have fatty liver but no other family history of liver disease.  Labs from PCP requested.  Advised updating HFP and decrease alcohol consumption to no more than 2 drinks per day.  Advised to avoid OTC supplements such as Ashwaganda and turmeric.  Advise regular exercise and balanced diet with avoiding sweets and processed foods..  Start Benefiber for constipation improvement.  HFP in June with normal LFTs.  Today:  Discussed the use of AI scribe software for clinical note  transcription with the patient, who gave verbal consent to proceed.  He has elevated liver enzymes identified during a recent blood test with PCP. He attributes this to a weekend of heavy drinking prior to the test. Since June, he has significantly reduced his alcohol consumption from heavy drinking every weekend to about once a month or less.   He experiences occasional constipation, which he associates with a high-protein, low-fiber diet. He consumes approximately 200 grams of protein and 20 grams of fiber daily. Increasing his intake of vegetables and fruits has alleviated the constipation since last visit. No issues with hydration, as he drinks close to a gallon of water daily.  He is currently taking fish oil daily and has been trying to manage his weight through cycles of bulking and cutting. He has found it increasingly difficult to lose weight as he ages. He has previously reduced his weight from 240 pounds to around 205-210 pounds but is now struggling to achieve the same results. He is unsure if this is because of his age or if he has not been as strict with his diet.   He has a family history of fatty liver disease, as his mother also has the condition. He was unaware of his own condition until recently and notes that his liver enzyme levels have been slightly elevated in the past, with the most recent test showing a more significant increase than the normal one prior.      Wt  Readings from Last 5 Encounters:  11/27/23 249 lb 6.4 oz (113.1 kg)  08/21/23 248 lb 12.8 oz (112.9 kg)  03/31/22 235 lb (106.6 kg)  09/02/13 225 lb (102.1 kg) (98%, Z= 1.96)*   * Growth percentiles are based on CDC (Boys, 2-20 Years) data.    Current Outpatient Medications  Medication Sig Dispense Refill   BLACK CURRANT SEED OIL PO Take by mouth.     Glutamine 500 MG CAPS Take by mouth.     Multiple Vitamin (MULTIVITAMIN) tablet Take 1 tablet by mouth daily.     Omega-3 Fatty Acids (FISH OIL) 1000 MG CAPS  Take by mouth.     No current facility-administered medications for this visit.    Past Medical History:  Diagnosis Date   Fatty liver     Past Surgical History:  Procedure Laterality Date   WISDOM TOOTH EXTRACTION      Family History  Problem Relation Age of Onset   Colon cancer Neg Hx     Allergies as of 11/27/2023   (No Known Allergies)    Social History   Socioeconomic History   Marital status: Single    Spouse name: Not on file   Number of children: Not on file   Years of education: Not on file   Highest education level: Not on file  Occupational History   Not on file  Tobacco Use   Smoking status: Never   Smokeless tobacco: Not on file  Substance and Sexual Activity   Alcohol use: Yes    Alcohol/week: 12.0 standard drinks of alcohol    Types: 12 Cans of beer per week    Comment: on weekends   Drug use: No   Sexual activity: Yes  Other Topics Concern   Not on file  Social History Narrative   Not on file   Social Drivers of Health   Financial Resource Strain: Not on file  Food Insecurity: Not on file  Transportation Needs: Not on file  Physical Activity: Not on file  Stress: Not on file  Social Connections: Unknown (07/13/2021)   Received from [facility]   Social Network    Social Network: Not on file     Review of Systems   Gen: Denies fever, chills, anorexia. Denies fatigue, weakness, weight loss.  CV: Denies chest pain, palpitations, syncope, peripheral edema, and claudication. Resp: Denies dyspnea at rest, cough, wheezing, coughing up blood, and pleurisy. GI: See HPI Derm: Denies rash, itching, dry skin Psych: Denies depression, anxiety, memory loss, confusion. No homicidal or suicidal ideation.  Heme: Denies bruising, bleeding, and enlarged lymph nodes.  Physical Exam   BP 128/88 (BP Location: Right Arm, Patient Position: Sitting, Cuff Size: Large)   Pulse (!) 57   Temp 98.7 F (37.1 C) (Oral)   Ht 5' 9 (1.753 m)   Wt 249 lb  6.4 oz (113.1 kg)   SpO2 99%   BMI 36.83 kg/m   General:   Alert and oriented. No distress noted. Pleasant and cooperative.  Head:  Normocephalic and atraumatic. Eyes:  Conjuctiva clear without scleral icterus. Mouth:  Oral mucosa pink and moist. Good dentition. No lesions. Abdomen:  +BS, soft, non-tender and non-distended. No rebound or guarding. No HSM or masses noted. Rectal: deferred Msk:  Symmetrical without gross deformities. Normal posture. Extremities:  Without edema. Neurologic:  Alert and  oriented x4 Psych:  Alert and cooperative. Normal mood and affect.  Assessment & Plan  FINIS HENDRICKSEN is a 28 y.o. male  presenting today for follow up of fatty liver.      Fatty liver disease with elevated liver enzymes Fatty liver disease with intermittently elevated liver enzymes, likely related to alcohol consumption and dietary habits. Liver enzymes were elevated during a recent blood test, potentially due to acute alcohol intake. Risk factor also includes excess weight (BMI 36). Family history of fatty liver disease. Managed with lifestyle modifications, including reduced alcohol consumption and a balanced diet. Monitoring for fibrosis with further testing planned to assess liver stiffness and fibrosis stage. Discussed potential use of Wegovy for weight management or Rezdiffra for management of MASH if fibrosis stage F2 or F3 is confirmed, as per recent approval guidelines. - Order blood tests for liver stiffness and fibrosis assessment. - Monitor liver enzymes and fibrosis markers at minimum every six months. - Consider referral back to primary care for potential [facility] approval if fibrosis stage F2 or F3 is confirmed. - Mediterranean diet advised as well as healthy lifestyle including regular exercise, avoiding processed foods, and limiting alcohol use.   Diet-related constipation Intermittent constipation likely related to high protein and low fiber diet. Symptoms improve with  increased dietary fiber intake and balanced diet. Adequate hydration supports bowel regularity. - Continue balanced diet with increased fiber intake. - Maintain adequate hydration.      Follow up   Follow up 6 months, minimal HFP every 6 months.    Charmaine Melia, MSN, FNP-BC, AGACNP-BC [facility]

## 2023-11-27 ENCOUNTER — Encounter: Payer: Self-pay | Admitting: Gastroenterology

## 2023-11-27 ENCOUNTER — Ambulatory Visit (INDEPENDENT_AMBULATORY_CARE_PROVIDER_SITE_OTHER): Admitting: Gastroenterology

## 2023-11-27 VITALS — BP 128/88 | HR 57 | Temp 98.7°F | Ht 69.0 in | Wt 249.4 lb

## 2023-11-27 DIAGNOSIS — K5900 Constipation, unspecified: Secondary | ICD-10-CM | POA: Diagnosis not present

## 2023-11-27 DIAGNOSIS — K760 Fatty (change of) liver, not elsewhere classified: Secondary | ICD-10-CM | POA: Diagnosis not present

## 2023-11-27 NOTE — Patient Instructions (Addendum)
 I have ordered some additional lab testing for you for fibrosis testing.  Pending the results this may make it easier for you to get something like Wegovy and/or a medication for fatty liver called Rezdiffra.   I will get your most recent labs from your PCP.   Instructions for fatty liver: Recommend 1-2# weight loss per week until ideal body weight through exercise & diet. Low fat/cholesterol diet.   Avoid sweets, sodas, fruit juices, sweetened beverages like tea, etc. Gradually increase exercise from 15 min daily up to 1 hr per day 5 days/week. Limit alcohol use.  Even though you are not having typical thyroid dysfunction symptoms I will check this as well to see if this is affecting metabolism in regards to your attempts at weight loss not being as successful.   It was a pleasure to see you today. I want to create trusting relationships with patients. If you receive a survey regarding your visit,  I greatly appreciate you taking time to fill this out on paper or through your MyChart. I value your feedback.  Charmaine Melia, MSN, FNP-BC, AGACNP-BC [facility]

## 2024-01-01 ENCOUNTER — Encounter: Payer: Self-pay | Admitting: Radiology

## 2024-05-27 ENCOUNTER — Ambulatory Visit: Admitting: Gastroenterology
